# Patient Record
Sex: Male | Born: 1970 | Race: Black or African American | Hispanic: No | Marital: Married | Smoking: Current every day smoker
Health system: Southern US, Community
[De-identification: ages and names within clinical notes are randomized; demographics above are authoritative.]

---

## 2011-12-28 ENCOUNTER — Other Ambulatory Visit: Payer: Self-pay | Admitting: Specialist

## 2011-12-28 DIAGNOSIS — M545 Low back pain: Secondary | ICD-10-CM

## 2011-12-31 ENCOUNTER — Ambulatory Visit
Admission: RE | Admit: 2011-12-31 | Discharge: 2011-12-31 | Disposition: A | Discharge status: Home / Self Care | Payer: Worker's Compensation | Source: Ambulatory Visit | Attending: Specialist | Admitting: Specialist

## 2011-12-31 VITALS — BP 139/91 | HR 67 | Ht 71.0 in | Wt 256.0 lb

## 2011-12-31 DIAGNOSIS — M545 Low back pain: Secondary | ICD-10-CM

## 2011-12-31 MED ORDER — IOHEXOL 180 MG/ML  SOLN
15.0000 mL | Freq: Once | INTRAMUSCULAR | Status: AC | PRN
  Administered 2011-12-31: 15 mL via INTRATHECAL

## 2011-12-31 MED ORDER — DIAZEPAM 5 MG PO TABS
10.0000 mg | ORAL_TABLET | Freq: Once | ORAL | Status: AC
  Administered 2011-12-31: 10 mg via ORAL

## 2011-12-31 NOTE — Progress Notes (Signed)
Patient states he has been off Tramadol for the past two days.  jkl 

## 2013-05-03 ENCOUNTER — Emergency Department (HOSPITAL_COMMUNITY)
Admission: EM | Admit: 2013-05-03 | Discharge: 2013-05-03 | Disposition: A | Discharge status: Home / Self Care | Payer: 59 | Attending: Emergency Medicine | Admitting: Emergency Medicine

## 2013-05-03 ENCOUNTER — Emergency Department (HOSPITAL_COMMUNITY): Payer: 59

## 2013-05-03 ENCOUNTER — Encounter (HOSPITAL_COMMUNITY): Payer: Self-pay | Admitting: Emergency Medicine

## 2013-05-03 DIAGNOSIS — Y9389 Activity, other specified: Secondary | ICD-10-CM | POA: Insufficient documentation

## 2013-05-03 DIAGNOSIS — R55 Syncope and collapse: Secondary | ICD-10-CM

## 2013-05-03 DIAGNOSIS — W1809XA Striking against other object with subsequent fall, initial encounter: Secondary | ICD-10-CM | POA: Insufficient documentation

## 2013-05-03 DIAGNOSIS — S139XXA Sprain of joints and ligaments of unspecified parts of neck, initial encounter: Secondary | ICD-10-CM | POA: Insufficient documentation

## 2013-05-03 DIAGNOSIS — S161XXA Strain of muscle, fascia and tendon at neck level, initial encounter: Secondary | ICD-10-CM

## 2013-05-03 DIAGNOSIS — Y9289 Other specified places as the place of occurrence of the external cause: Secondary | ICD-10-CM | POA: Insufficient documentation

## 2013-05-03 DIAGNOSIS — S0990XA Unspecified injury of head, initial encounter: Secondary | ICD-10-CM | POA: Insufficient documentation

## 2013-05-03 LAB — CBC WITH DIFFERENTIAL/PLATELET
Eosinophils Absolute: 0.1 10*3/uL (ref 0.0–0.7)
HCT: 44 % (ref 39.0–52.0)
Hemoglobin: 15 g/dL (ref 13.0–17.0)
Lymphs Abs: 4.6 10*3/uL — ABNORMAL HIGH (ref 0.7–4.0)
MCH: 29.4 pg (ref 26.0–34.0)
Monocytes Absolute: 0.6 10*3/uL (ref 0.1–1.0)
Monocytes Relative: 6 % (ref 3–12)
Neutrophils Relative %: 47 % (ref 43–77)
RBC: 5.1 MIL/uL (ref 4.22–5.81)

## 2013-05-03 LAB — BASIC METABOLIC PANEL
BUN: 16 mg/dL (ref 6–23)
Chloride: 103 mEq/L (ref 96–112)
GFR calc non Af Amer: 57 mL/min — ABNORMAL LOW (ref >90)
Glucose, Bld: 94 mg/dL (ref 70–99)
Potassium: 3.8 mEq/L (ref 3.5–5.1)

## 2013-05-03 MED ORDER — CYCLOBENZAPRINE HCL 10 MG PO TABS: 10.0000 mg | ORAL_TABLET | Freq: Two times a day (BID) | ORAL | Status: DC | PRN

## 2013-05-03 MED ORDER — METHOCARBAMOL 500 MG PO TABS: 500.0000 mg | ORAL_TABLET | Freq: Two times a day (BID) | ORAL | Status: DC

## 2013-05-03 MED ORDER — DIAZEPAM 5 MG/ML IJ SOLN
5.0000 mg | Freq: Once | INTRAMUSCULAR | Status: AC
  Administered 2013-05-03: 5 mg via INTRAMUSCULAR
  Filled 2013-05-03: qty 2

## 2013-05-03 MED ORDER — HYDROCODONE-ACETAMINOPHEN 5-325 MG PO TABS: 2.0000 | ORAL_TABLET | ORAL | Status: DC | PRN

## 2013-05-03 MED ORDER — KETOROLAC TROMETHAMINE 60 MG/2ML IM SOLN
60.0000 mg | Freq: Once | INTRAMUSCULAR | Status: AC
  Administered 2013-05-03: 60 mg via INTRAMUSCULAR
  Filled 2013-05-03: qty 2

## 2013-05-03 NOTE — ED Provider Notes (Signed)
History     CSN: [id number]  Arrival date & time 05/03/13  2038   First MD Initiated Contact with Patient 05/03/13 2040      Chief Complaint  Patient presents with  . Syncopal Episode   . Head Injury    (Consider location/radiation/quality/duration/timing/severity/associated sxs/prior treatment) HPI Comments: Patient reports that last night he was talking with family and suddenly passed out. He says there was no warning sign, did not have any chest pain, shortness of breath, palpitations, dizziness, blurred vision. Family reports he fell backwards and he does have a large not on the back of his head. Patient does report a mild headache and moderate to severe pain across the back of the neck since the fall. He has taken Tylenol without improvement. Patient has not had any further symptoms today other than the neck pain. He has not had any recent illness, no fever, sore throat, cough, vomiting or diarrhea.  Patient is a 42 y.o. male presenting with head injury.  Head Injury Associated symptoms: headache and neck pain     No past medical history on file.  No past surgical history on file.  No family history on file.  History  Substance Use Topics  . Smoking status: Not on file  . Smokeless tobacco: Not on file  . Alcohol Use: Not on file      Review of Systems  HENT: Positive for neck pain.   Respiratory: Negative.   Cardiovascular: Negative.   Neurological: Positive for syncope and headaches.  All other systems reviewed and are negative.    Allergies  Review of patient's allergies indicates no known allergies.  Home Medications  No current outpatient prescriptions on file.  There were no vitals taken for this visit.  Physical Exam  Constitutional: He is oriented to person, place, and time. He appears well-developed and well-nourished. No distress.  HENT:  Head: Normocephalic and atraumatic.  Right Ear: Hearing normal.  Left Ear: Hearing normal.  Nose: Nose  normal.  Mouth/Throat: Oropharynx is clear and moist and mucous membranes are normal.  Eyes: Conjunctivae and EOM are normal. Pupils are equal, round, and reactive to light.  Neck: Normal range of motion. Neck supple. Muscular tenderness present. No spinous process tenderness present.  Cardiovascular: Regular rhythm, S1 normal and S2 normal.  Exam reveals no gallop and no friction rub.   No murmur heard. Pulmonary/Chest: Effort normal and breath sounds normal. No respiratory distress. He exhibits no tenderness.  Abdominal: Soft. Normal appearance and bowel sounds are normal. There is no hepatosplenomegaly. There is no tenderness. There is no rebound, no guarding, no tenderness at McBurney's point and negative Murphy's sign. No hernia.  Musculoskeletal: Normal range of motion.       Thoracic back: Normal.       Lumbar back: Normal.  Neurological: He is alert and oriented to person, place, and time. He has normal strength. No cranial nerve deficit or sensory deficit. Coordination normal. GCS eye subscore is 4. GCS verbal subscore is 5. GCS motor subscore is 6.  Skin: Skin is warm, dry and intact. No rash noted. No cyanosis.  Psychiatric: He has a normal mood and affect. His speech is normal and behavior is normal. Thought content normal.    ED Course  Procedures (including critical care time)  EKG:  Date: 05/03/2013  Rate: 65  Rhythm: normal sinus rhythm  QRS Axis: normal  Intervals: normal  ST/T Wave abnormalities: normal  Conduction Disutrbances:none  Narrative Interpretation:   Old EKG  Reviewed: none available    Labs Reviewed  CBC WITH DIFFERENTIAL - Abnormal; Notable for the following:    RDW 15.7 (*)    Lymphs Abs 4.6 (*)    All other components within normal limits  BASIC METABOLIC PANEL - Abnormal; Notable for the following:    Creatinine, Ser 1.47 (*)    GFR calc non Af Amer 57 (*)    GFR calc Af Amer 66 (*)    All other components within normal limits  TROPONIN I   D-DIMER, QUANTITATIVE  URINALYSIS, ROUTINE W REFLEX MICROSCOPIC   Dg Chest 2 View  05/03/2013  *RADIOLOGY REPORT*  Clinical Data: Fall, syncope, smoking history  CHEST - 2 VIEW  Comparison: none  Findings: Cardiomediastinal contours within normal range.  No confluent airspace opacity.  No pleural effusion or pneumothorax. No acute osseous finding.  IMPRESSION:  No radiographic evidence of acute cardiopulmonary process.   Original Report Authenticated By: Jearld Lesch, M.D.    Ct Head Wo Contrast  05/03/2013  *RADIOLOGY REPORT*  Clinical Data:  Syncope, head trauma.  CT HEAD WITHOUT CONTRAST CT CERVICAL SPINE WITHOUT CONTRAST  Technique:  Multidetector CT imaging of the head and cervical spine was performed following the standard protocol without intravenous contrast.  Multiplanar CT image reconstructions of the cervical spine were also generated.  Comparison:   None  CT HEAD  Findings: Posterior scalp swelling.  No underlying calvarial fracture. There is no evidence for acute hemorrhage, hydrocephalus, mass lesion, or abnormal extra-axial fluid collection.  No definite CT evidence for acute infarction.  The visualized paranasal sinuses and mastoid air cells are predominately clear.  IMPRESSION: Posterior scalp swelling.  No underlying calvarial fracture or acute intracranial abnormality.  CT CERVICAL SPINE  Findings: Lung apices are clear.  Maintained craniocervical relationship.  No dens fracture.  Loss of normal cervical lordosis. Maintained vertebral body height.  No dislocation.  Paravertebral soft tissues within normal range.  IMPRESSION: Loss of normal cervical lordosis may be secondary to positioning, muscle spasm, or ligamentous injury.  No static evidence for acute fracture or dislocation of the cervical spine.   Original Report Authenticated By: Jearld Lesch, M.D.    Ct Cervical Spine Wo Contrast  05/03/2013  *RADIOLOGY REPORT*  Clinical Data:  Syncope, head trauma.  CT HEAD WITHOUT  CONTRAST CT CERVICAL SPINE WITHOUT CONTRAST  Technique:  Multidetector CT imaging of the head and cervical spine was performed following the standard protocol without intravenous contrast.  Multiplanar CT image reconstructions of the cervical spine were also generated.  Comparison:   None  CT HEAD  Findings: Posterior scalp swelling.  No underlying calvarial fracture. There is no evidence for acute hemorrhage, hydrocephalus, mass lesion, or abnormal extra-axial fluid collection.  No definite CT evidence for acute infarction.  The visualized paranasal sinuses and mastoid air cells are predominately clear.  IMPRESSION: Posterior scalp swelling.  No underlying calvarial fracture or acute intracranial abnormality.  CT CERVICAL SPINE  Findings: Lung apices are clear.  Maintained craniocervical relationship.  No dens fracture.  Loss of normal cervical lordosis. Maintained vertebral body height.  No dislocation.  Paravertebral soft tissues within normal range.  IMPRESSION: Loss of normal cervical lordosis may be secondary to positioning, muscle spasm, or ligamentous injury.  No static evidence for acute fracture or dislocation of the cervical spine.   Original Report Authenticated By: Jearld Lesch, M.D.      Diagnosis: 1. Syncope 2. Cervical strain    MDM  Patient presents  to the ER for evaluation of neck pain from a fall. The patient reports that he passed out yesterday, fell backwards and this was the cause of her neck pain. He has a normal neurologic examination currently. Pain does not radiate to the arms, no upper extremity weakness. CT of head shows hematoma without any intracranial injury. CT of cervical spine shows spasm without any evidence of bone injury. Patient treated with analgesia and muscle relaxers.  It's not clear what caused a syncopal episode yesterday. Further discussion with the patient, however, reveals that he worked all day yesterday and then drove to [place] to see family. He  didn't appear to 4 AM and at 7 AM yesterday morning. He thinks he was just overtired. Patient was counseled that he needs to get followup with his primary care doctor needs to be cautious about driving until he is certain that he is feeling better. He understands the risks.       Gilda Crease, MD 05/03/13 2234

## 2013-05-03 NOTE — ED Notes (Addendum)
MD at bedside. Dr. Pollina at bedside.  

## 2013-05-03 NOTE — ED Notes (Signed)
Patient transported to CT 

## 2013-05-03 NOTE — ED Notes (Signed)
Pt states he was talking with a family member last night and just "passed out". Pt states he doesn't know why he passed out. States he hit his head on concrete and has a knot on the back of his head. Pt denies any nausea or vomiting since event happened. Pt states his main complaint now is neck pain. Pt states he has taken Tylenol today for pain. Pt arrives with family member. Pt is a/o x 4.

## 2013-09-08 IMAGING — CT CT CERVICAL SPINE W/O CM
2 of 4 series · 6 of 14 positions shown, 7 images · non-contrast
Comparison: None

CT HEAD

CLINICAL DATA: Syncope, head trauma.

CT HEAD WITHOUT CONTRAST
CT CERVICAL SPINE WITHOUT CONTRAST
TECHNIQUE: Multidetector CT imaging of the head and cervical spine
was performed following the standard protocol without intravenous
contrast.  Multiplanar CT image reconstructions of the cervical
spine were also generated.

[Series 5: c-spine st · axial · 0.29mm/px · z∈[+1136,+1220]mm · 3 of 86 slices shown]
[im 22/86  bone]
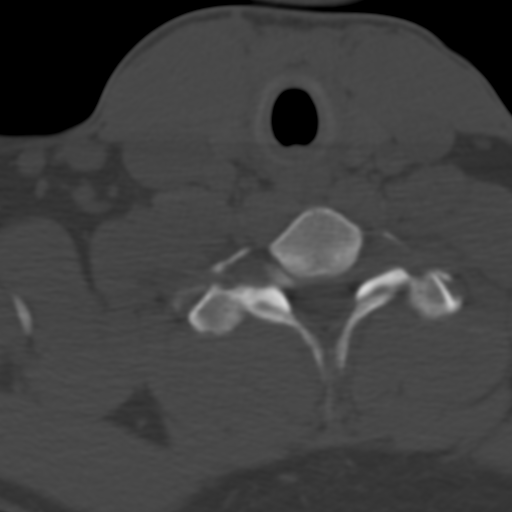
[im 43/86  bone]
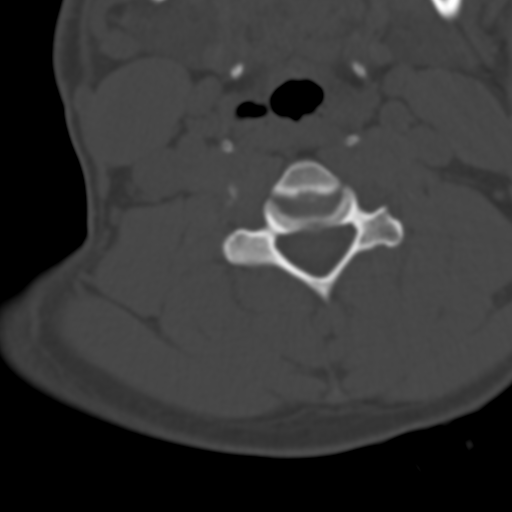
[im 64/86  bone]
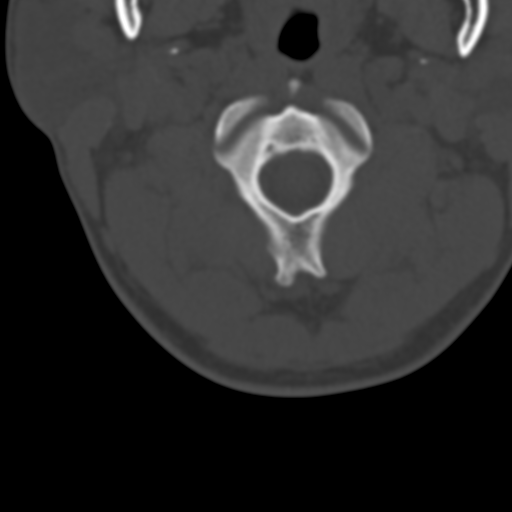

[Series 8: axial recon · axial · 0.23mm/px · z∈[+1112,+1209]mm · 3 of 100 slices shown, 4 images]
[im 25/100  soft-tissue]
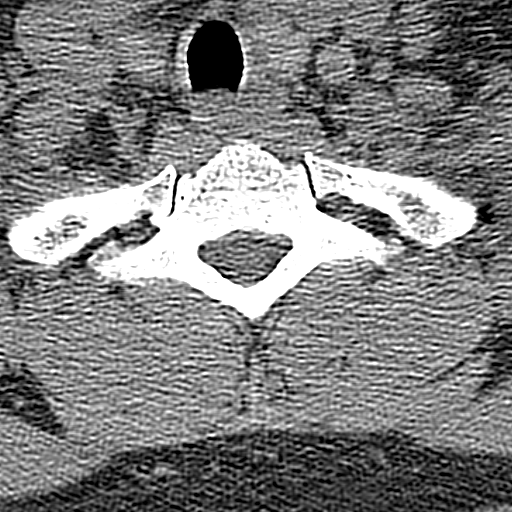
[im 25/100  bone]
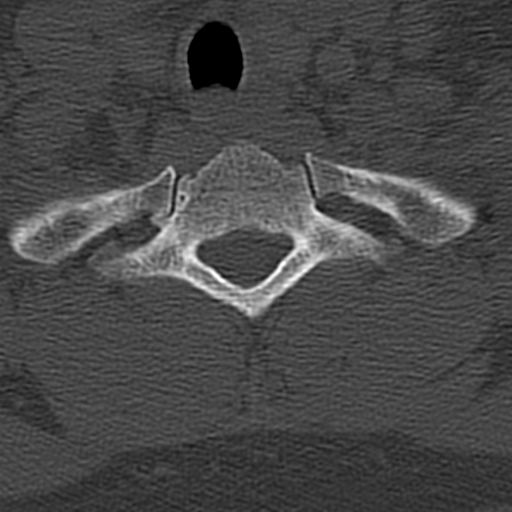
[im 50/100  bone]
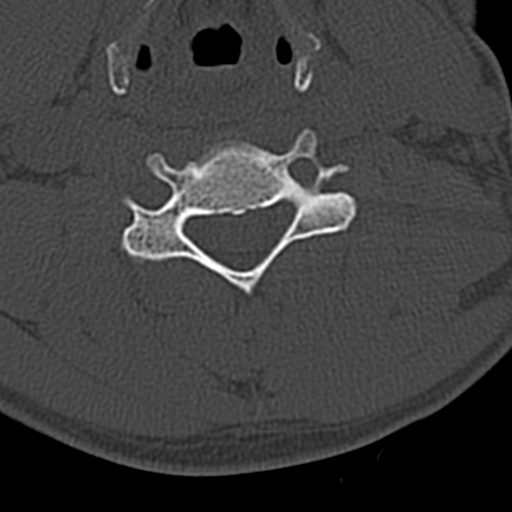
[im 75/100  bone]
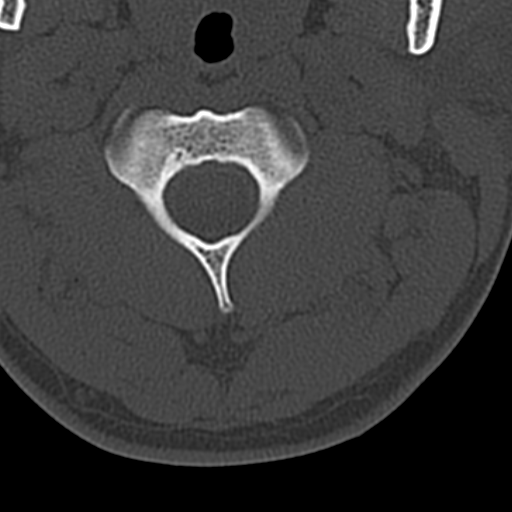

[6 of 14 positions shown; findings below may reference images not displayed]

FINDINGS: Posterior scalp swelling.  No underlying calvarial
fracture. There is no evidence for acute hemorrhage, hydrocephalus,
mass lesion, or abnormal extra-axial fluid collection.  No definite
CT evidence for acute infarction.  The visualized paranasal sinuses
and mastoid air cells are predominately clear.
IMPRESSION: Posterior scalp swelling.  No underlying calvarial fracture or
acute intracranial abnormality.

CT CERVICAL SPINE
FINDINGS: Lung apices are clear.  Maintained craniocervical
relationship.  No dens fracture.  Loss of normal cervical lordosis.
Maintained vertebral body height.  No dislocation.  Paravertebral
soft tissues within normal range.
IMPRESSION: Loss of normal cervical lordosis may be secondary to positioning,
muscle spasm, or ligamentous injury.  No static evidence for acute
fracture or dislocation of the cervical spine.

## 2013-09-08 IMAGING — CR DG CHEST 2V
2 series · 2 of 2 positions shown · non-contrast
Comparison: none

CLINICAL DATA: Fall, syncope, smoking history

CHEST - 2 VIEW

[w chest pa]
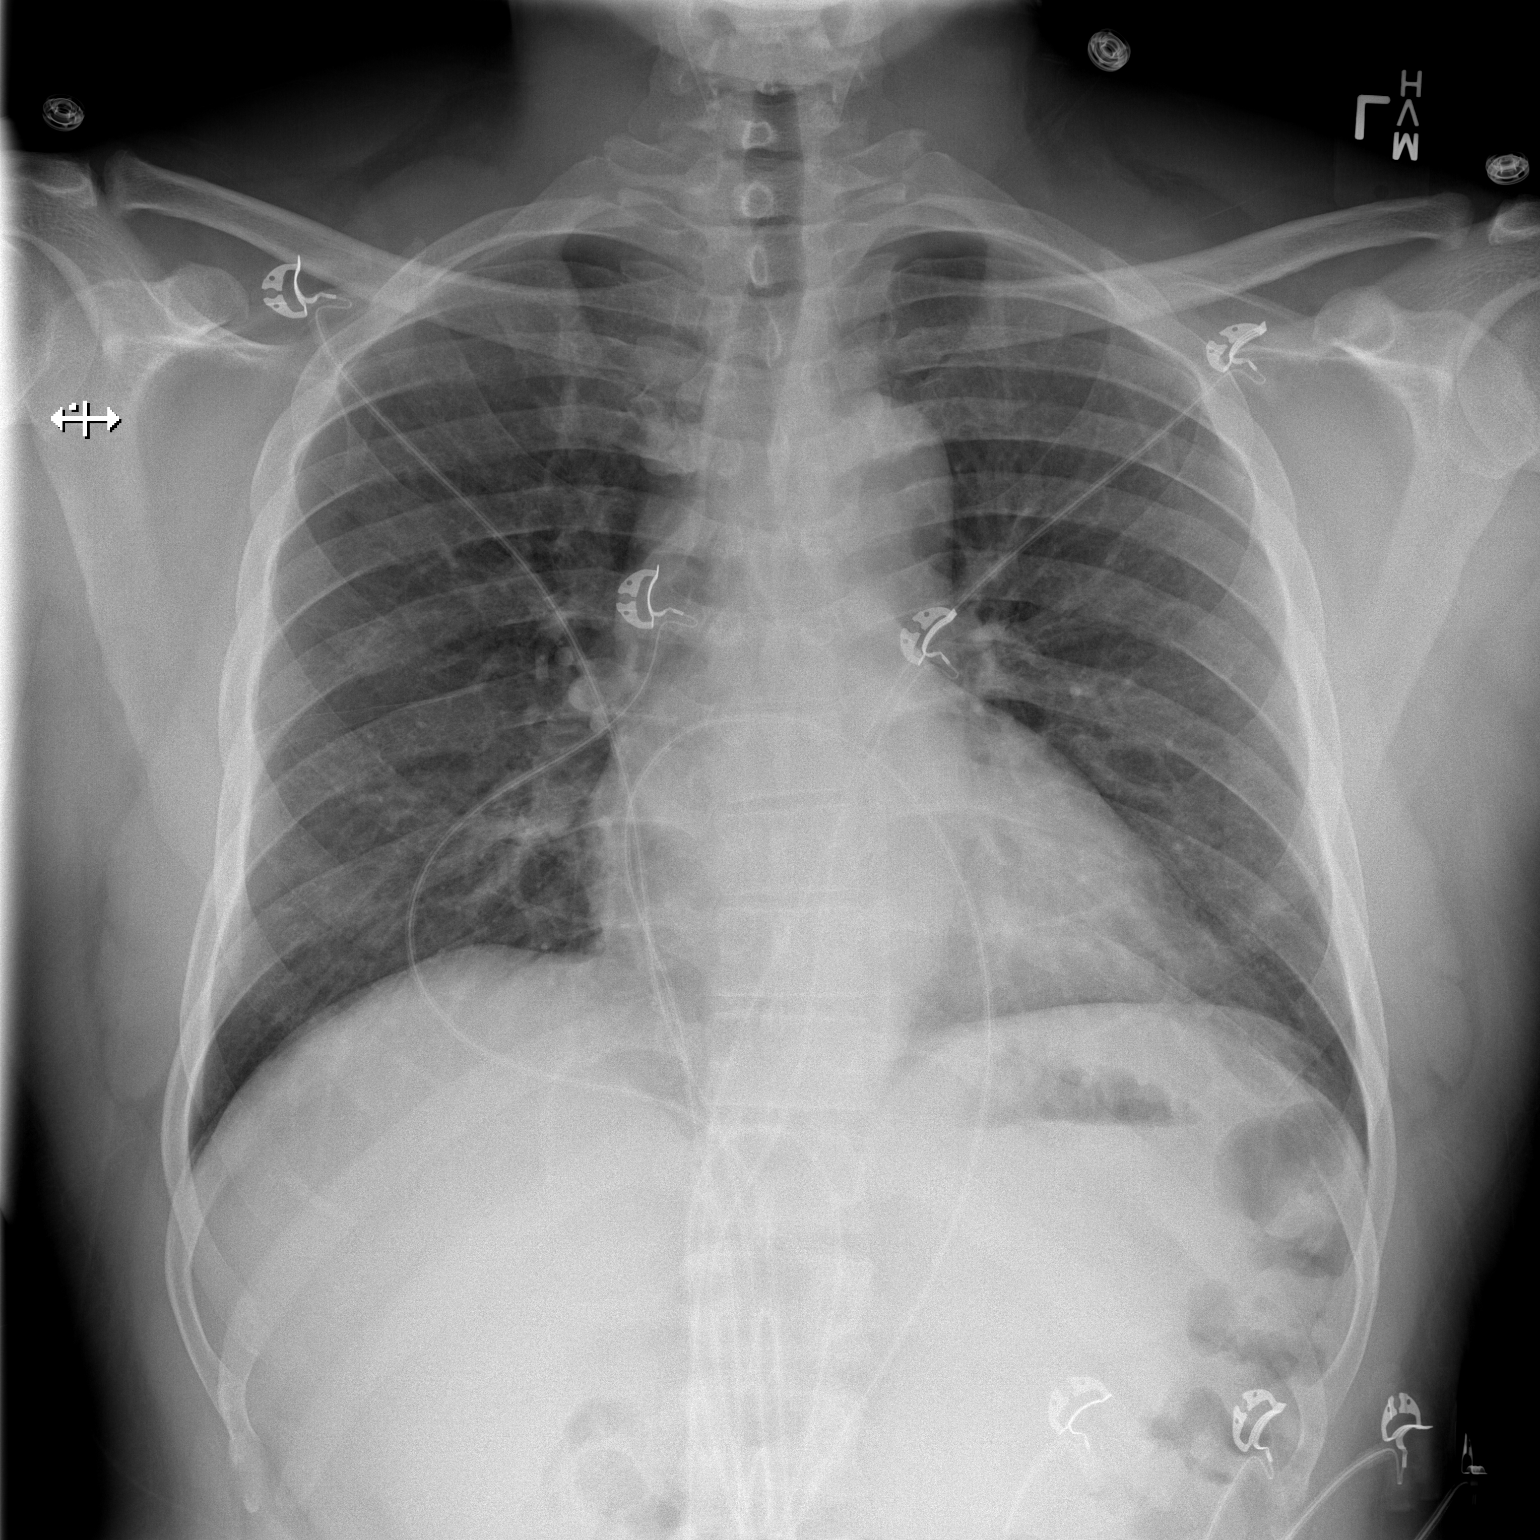

[w chest lat]
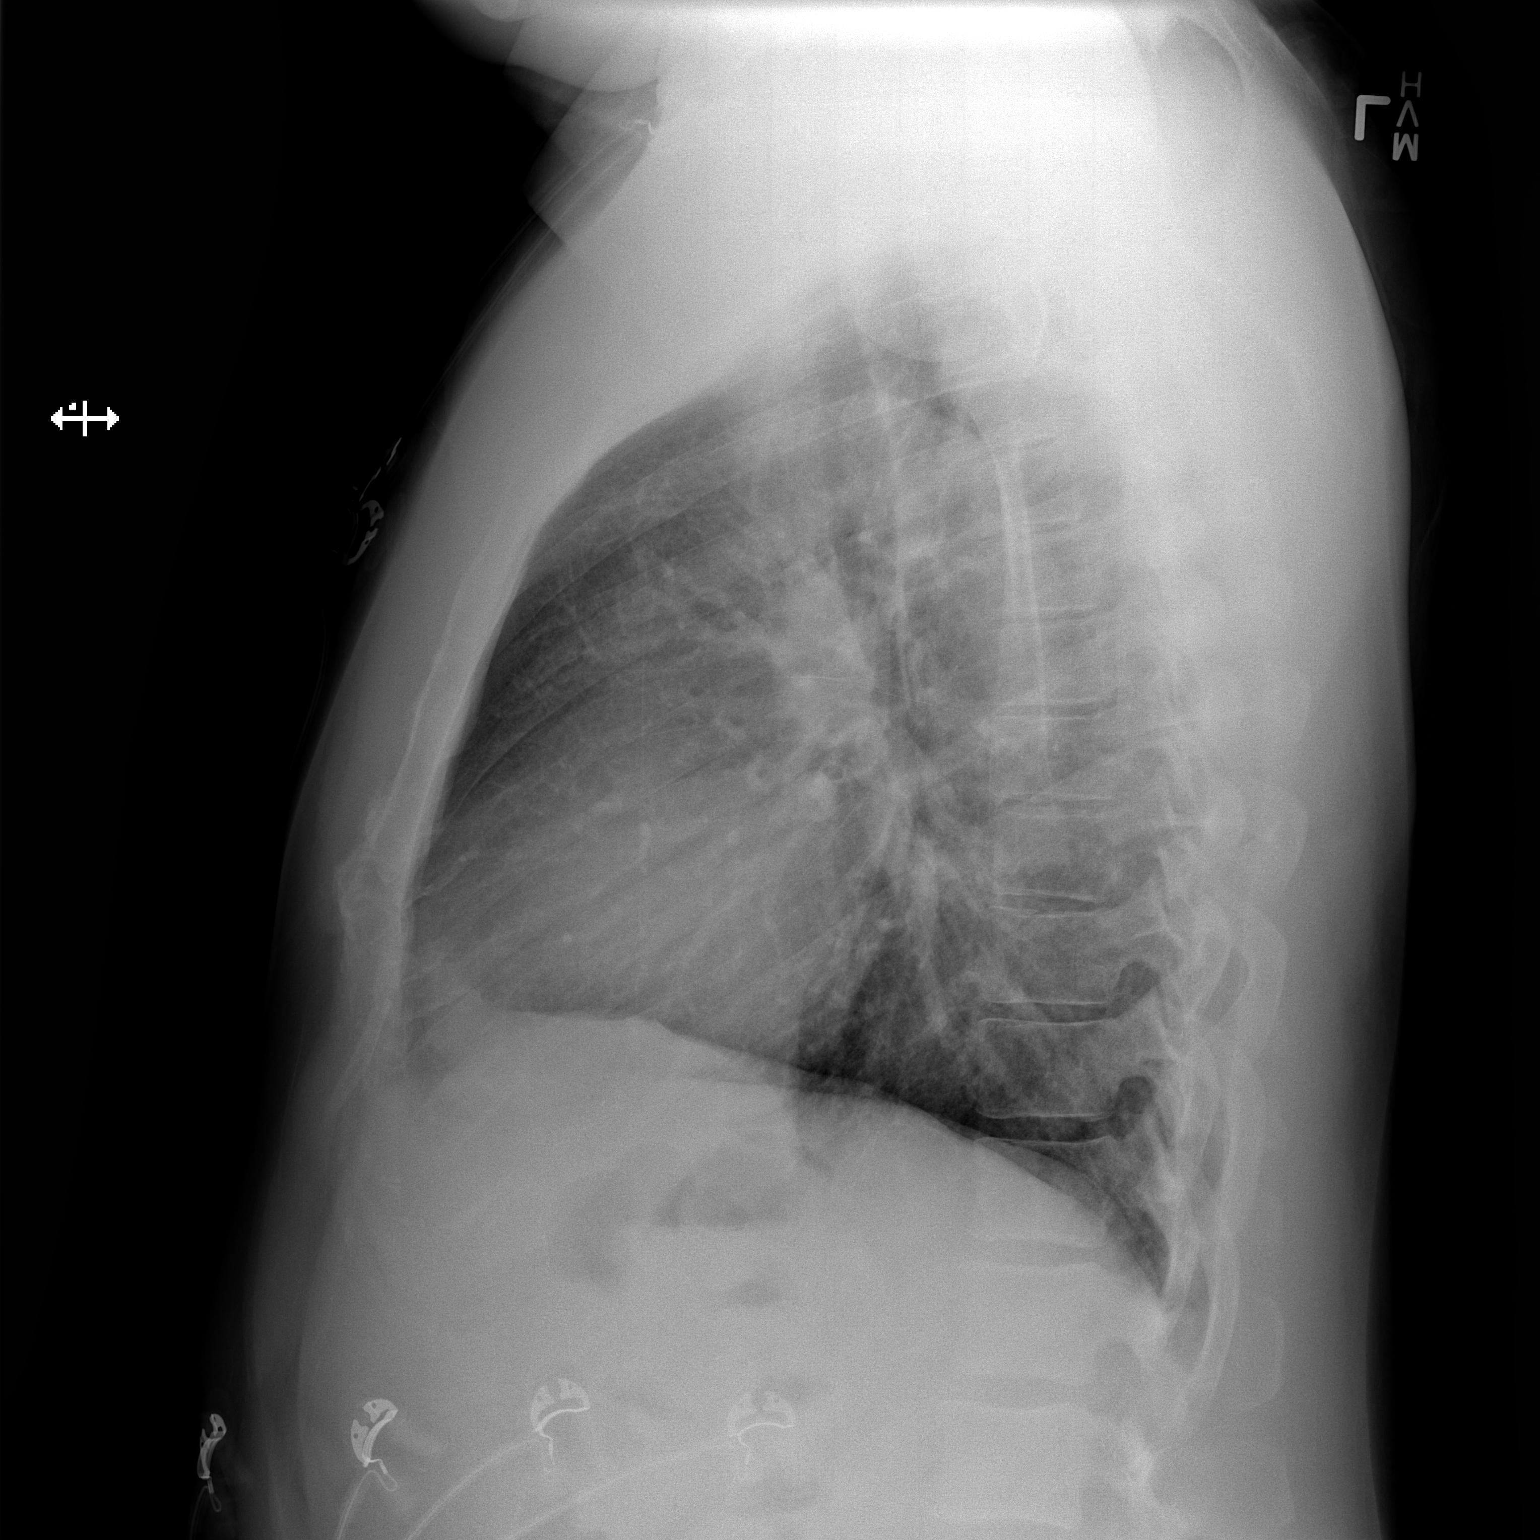

[2 of 2 positions shown; findings below may reference images not displayed]

FINDINGS: Cardiomediastinal contours within normal range.  No
confluent airspace opacity.  No pleural effusion or pneumothorax.
No acute osseous finding.
IMPRESSION: No radiographic evidence of acute cardiopulmonary process.}

## 2014-08-06 ENCOUNTER — Emergency Department (HOSPITAL_COMMUNITY)
Admission: EM | Admit: 2014-08-06 | Discharge: 2014-08-06 | Disposition: A | Discharge status: Home / Self Care | Payer: 59 | Attending: Emergency Medicine | Admitting: Emergency Medicine

## 2014-08-06 DIAGNOSIS — T63461A Toxic effect of venom of wasps, accidental (unintentional), initial encounter: Secondary | ICD-10-CM | POA: Insufficient documentation

## 2014-08-06 DIAGNOSIS — Y939 Activity, unspecified: Secondary | ICD-10-CM | POA: Insufficient documentation

## 2014-08-06 DIAGNOSIS — J3489 Other specified disorders of nose and nasal sinuses: Secondary | ICD-10-CM | POA: Diagnosis not present

## 2014-08-06 DIAGNOSIS — Y929 Unspecified place or not applicable: Secondary | ICD-10-CM | POA: Insufficient documentation

## 2014-08-06 DIAGNOSIS — T6391XA Toxic effect of contact with unspecified venomous animal, accidental (unintentional), initial encounter: Secondary | ICD-10-CM | POA: Diagnosis not present

## 2014-08-06 DIAGNOSIS — T63441A Toxic effect of venom of bees, accidental (unintentional), initial encounter: Secondary | ICD-10-CM

## 2014-08-06 MED ORDER — PREDNISONE 20 MG PO TABS
60.0000 mg | ORAL_TABLET | Freq: Once | ORAL | Status: AC
  Administered 2014-08-06: 60 mg via ORAL
  Filled 2014-08-06: qty 3

## 2014-08-06 MED ORDER — FAMOTIDINE 20 MG PO TABS
40.0000 mg | ORAL_TABLET | Freq: Once | ORAL | Status: AC
  Administered 2014-08-06: 40 mg via ORAL
  Filled 2014-08-06: qty 2

## 2014-08-06 MED ORDER — PREDNISONE 10 MG PO TABS: 60.0000 mg | ORAL_TABLET | Freq: Every day | ORAL | Status: DC

## 2014-08-06 MED ORDER — EPINEPHRINE 0.3 MG/0.3ML IJ SOAJ: 0.3000 mg | Freq: Once | INTRAMUSCULAR | Status: AC

## 2014-08-06 NOTE — ED Provider Notes (Signed)
CSN: [id number]     Arrival date & time 08/06/14  1007 History   First MD Initiated Contact with Patient 08/06/14 1009     Chief Complaint  Patient presents with  . Allergic Reaction     (Consider location/radiation/quality/duration/timing/severity/associated sxs/prior Treatment) Patient is a 43 y.o. male presenting with allergic reaction and general illness. The history is provided by the patient.  Allergic Reaction Presenting symptoms: itching, rash and swelling   Presenting symptoms: no wheezing   Severity:  Moderate Prior allergic episodes:  No prior episodes Context: insect bite/sting   Relieved by:  Nothing Worsened by:  Nothing tried Ineffective treatments:  Antihistamines Illness Severity:  Severe Onset quality:  Sudden Duration:  1 hour Timing:  Constant Progression:  Unchanged Chronicity:  New Associated symptoms: rash   Associated symptoms: no abdominal pain, no chest pain, no congestion, no diarrhea, no fever, no headaches, no myalgias, no shortness of breath, no vomiting and no wheezing    43 yo M with a chief complaint allergic reaction. Patient was stung by a bee approximately 30-45 minutes ago. Patient was stung to the right side of his neck unknown if he removed the stinger. After which patient developed a rash all over her stuffy nose congestion. Patient denies any difficulty breathing patient denies any vomiting or diarrhea. Patient denies any lightheadedness or palpitations. Patient denies any prior reaction like this.   No past medical history on file. No past surgical history on file. No family history on file. History  Substance Use Topics  . Smoking status: Not on file  . Smokeless tobacco: Not on file  . Alcohol Use: Not on file    Review of Systems  Constitutional: Negative for fever and chills.  HENT: Negative for congestion and facial swelling.   Eyes: Negative for discharge and visual disturbance.  Respiratory: Negative for shortness of  breath and wheezing.   Cardiovascular: Negative for chest pain and palpitations.  Gastrointestinal: Negative for vomiting, abdominal pain and diarrhea.  Musculoskeletal: Negative for arthralgias and myalgias.  Skin: Positive for itching and rash. Negative for color change.  Neurological: Negative for tremors, syncope and headaches.  Psychiatric/Behavioral: Negative for confusion and dysphoric mood.      Allergies  Bee venom  Home Medications   Prior to Admission medications   Medication Sig Start Date End Date Taking? Authorizing Provider  ibuprofen (ADVIL,MOTRIN) 200 MG tablet Take 400 mg by mouth daily as needed for headache.   Yes Historical Provider, MD  EPINEPHrine (EPIPEN 2-PAK) 0.3 mg/0.3 mL IJ SOAJ injection Inject 0.3 mLs (0.3 mg total) into the muscle once. 08/06/14   Melene Planan Inigo Lantigua, MD  predniSONE (DELTASONE) 10 MG tablet Take 6 tablets (60 mg total) by mouth daily. 08/06/14   Melene Planan Deloy Archey, MD   BP 140/97  Temp(Src) 97.6 F (36.4 C) (Oral)  Resp 16  SpO2 99% Physical Exam  Constitutional: He is oriented to person, place, and time. He appears well-developed and well-nourished.  HENT:  Head: Normocephalic and atraumatic.  Lip and facial swelling, erythema to the right side of the neck.  No noted intraoral swelling.  Eyes: EOM are normal. Pupils are equal, round, and reactive to light.  Neck: Normal range of motion. Neck supple. No JVD present.  Cardiovascular: Normal rate and regular rhythm.  Exam reveals no gallop and no friction rub.   No murmur heard. Pulmonary/Chest: No respiratory distress. He has no wheezes. He has no rales.  Abdominal: He exhibits no distension. There is no rebound and  no guarding.  Musculoskeletal: Normal range of motion.  Neurological: He is alert and oriented to person, place, and time.  Skin: Rash noted. No pallor.  Diffuse raised wheals throughout the body worse on abdomen and upper extremities.  Psychiatric: He has a normal mood and affect. His  behavior is normal.    ED Course  Procedures (including critical care time) Labs Review Labs Reviewed - No data to display  Imaging Review No results found.   EKG Interpretation None      MDM   Final diagnoses:  Allergic reaction to bee sting, accidental or unintentional, initial encounter    43 yo M with a chief complaint of allergic reaction. Patient now filleting definition of severe allergic reaction at this time. Patient took 2 Benadryl tablets at home. We'll treat with Zantac and prednisone. We'll observe the patient.  Symptoms improved, patient requesting discharge, will have follow up with wellness center.   11:49 AM:  I have discussed the diagnosis/risks/treatment options with the patient and believe the pt to be eligible for discharge home to follow-up with [facility] center. We also discussed returning to the ED immediately if new or worsening sx occur. We discussed the sx which are most concerning (e.g., sudden recurrence of symptoms, difficulty breathing, nausea, diarrhea) that necessitate immediate return. Medications administered to the patient during their visit and any new prescriptions provided to the patient are listed below.  Medications given during this visit Medications  famotidine (PEPCID) tablet 40 mg (40 mg Oral Given 08/06/14 1025)  predniSONE (DELTASONE) tablet 60 mg (60 mg Oral Given 08/06/14 1025)    New Prescriptions   EPINEPHRINE (EPIPEN 2-PAK) 0.3 MG/0.3 ML IJ SOAJ INJECTION    Inject 0.3 mLs (0.3 mg total) into the muscle once.   PREDNISONE (DELTASONE) 10 MG TABLET    Take 6 tablets (60 mg total) by mouth daily.       Melene Plan, MD 08/06/14 1149

## 2014-08-06 NOTE — ED Notes (Signed)
Is allergic to bee stings aND WAS STUNG IN NECK TODAY BROKE OUT IN HIVES AND NOSE IS STUFFY took 2 benadryl already

## 2014-08-09 NOTE — ED Provider Notes (Signed)
I saw and evaluated the patient, reviewed the resident's note and I agree with the findings and plan.   EKG Interpretation None       Patient with hives s/p insect sting. No throat or lung sx. No vomiting. No signs of severe rxn. Stable for d/c  Audree CamelScott T Dezyrae Kensinger, MD 08/09/14 1402

## 2017-01-10 ENCOUNTER — Encounter (HOSPITAL_COMMUNITY): Payer: Self-pay | Admitting: Emergency Medicine

## 2017-01-10 ENCOUNTER — Emergency Department (HOSPITAL_COMMUNITY)
Admission: EM | Admit: 2017-01-10 | Discharge: 2017-01-10 | Disposition: A | Discharge status: Home / Self Care | Payer: 59 | Attending: Emergency Medicine | Admitting: Emergency Medicine

## 2017-01-10 DIAGNOSIS — R509 Fever, unspecified: Secondary | ICD-10-CM | POA: Diagnosis not present

## 2017-01-10 DIAGNOSIS — R52 Pain, unspecified: Secondary | ICD-10-CM

## 2017-01-10 DIAGNOSIS — R6889 Other general symptoms and signs: Secondary | ICD-10-CM

## 2017-01-10 DIAGNOSIS — R05 Cough: Secondary | ICD-10-CM | POA: Diagnosis not present

## 2017-01-10 DIAGNOSIS — R059 Cough, unspecified: Secondary | ICD-10-CM

## 2017-01-10 DIAGNOSIS — F17200 Nicotine dependence, unspecified, uncomplicated: Secondary | ICD-10-CM | POA: Diagnosis not present

## 2017-01-10 MED ORDER — ACETAMINOPHEN 325 MG PO TABS
650.0000 mg | ORAL_TABLET | Freq: Once | ORAL | Status: AC | PRN
  Administered 2017-01-10: 650 mg via ORAL

## 2017-01-10 MED ORDER — FLUTICASONE PROPIONATE 50 MCG/ACT NA SUSP: 2.0000 | Freq: Every day | NASAL | 0 refills | Status: DC

## 2017-01-10 MED ORDER — OSELTAMIVIR PHOSPHATE 75 MG PO CAPS: 75.0000 mg | ORAL_CAPSULE | Freq: Two times a day (BID) | ORAL | 0 refills | Status: DC

## 2017-01-10 MED ORDER — ACETAMINOPHEN 325 MG PO TABS
ORAL_TABLET | ORAL | Status: AC
  Filled 2017-01-10: qty 2

## 2017-01-10 MED ORDER — HYDROCODONE-HOMATROPINE 5-1.5 MG/5ML PO SYRP: 5.0000 mL | ORAL_SOLUTION | Freq: Four times a day (QID) | ORAL | 0 refills | Status: DC | PRN

## 2017-01-10 MED ORDER — GUAIFENESIN 100 MG/5ML PO LIQD: 100.0000 mg | ORAL | 0 refills | Status: DC | PRN

## 2017-01-10 NOTE — ED Provider Notes (Signed)
[facility] Provider Note   CSN: [id number] Arrival date & time: 01/10/17  1742  By signing my name below, I, Linna DarnerRussell Turner, attest that this documentation has been prepared under the direction and in the presence of Sharen Hecklaudia Javan Gonzaga, PA-C. Electronically Signed: Linna Darnerussell Turner, Scribe. 01/10/2017. 9:17 PM.  History   Chief Complaint Chief Complaint  Patient presents with  . Generalized Body Aches  . Fever    The history is provided by the patient. No language interpreter was used.     HPI Comments: Joel Cruz is a 46 y.o. male who presents to the Emergency Department complaining of constant, gradually worsening, generalized body aches beginning two days ago. He reports associated subjective fever/chills, nasal congestion, decreased appetite, and a dry cough. He has tried Tylenol and Catering managerAlka Seltzer for his symptoms with minimal improvement. He notes he has Flonase at home but has not used it for his current symptoms. No known sick contacts with similar symptoms. Pt smokes ~ 1 ppd but has not smoked in the last two days. He did not receive a flu vaccination this season. No h/o influenza. No h/o asthma or COPD. No regular medications. He denies CP, SOB, sore throat, nausea, vomiting, or any other associated symptoms.  History reviewed. No pertinent past medical history.  There are no active problems to display for this patient.   History reviewed. No pertinent surgical history.     Home Medications    Prior to Admission medications   Medication Sig Start Date End Date Taking? Authorizing Provider  EPINEPHrine (EPIPEN 2-PAK) 0.3 mg/0.3 mL IJ SOAJ injection Inject 0.3 mLs (0.3 mg total) into the muscle once. 08/06/14   Melene Planan Floyd, DO  fluticasone (FLONASE) 50 MCG/ACT nasal spray Place 2 sprays into both nostrils daily. 01/10/17   Liberty Handylaudia J Hazelee Harbold, PA-C  guaiFENesin (ROBITUSSIN) 100 MG/5ML liquid Take 5-10 mLs (100-200 mg total) by mouth every 4 (four) hours as needed for  cough. 01/10/17   Liberty Handylaudia J Liesl Simons, PA-C  HYDROcodone-homatropine (HYCODAN) 5-1.5 MG/5ML syrup Take 5 mLs by mouth every 6 (six) hours as needed for cough. 01/10/17   Liberty Handylaudia J Tamotsu Wiederholt, PA-C  ibuprofen (ADVIL,MOTRIN) 200 MG tablet Take 400 mg by mouth daily as needed for headache.    Historical Provider, MD  oseltamivir (TAMIFLU) 75 MG capsule Take 1 capsule (75 mg total) by mouth every 12 (twelve) hours. 01/10/17   Liberty Handylaudia J Samul Mcinroy, PA-C  predniSONE (DELTASONE) 10 MG tablet Take 6 tablets (60 mg total) by mouth daily. 08/06/14   Melene Planan Floyd, DO    Family History History reviewed. No pertinent family history.  Social History Social History  Substance Use Topics  . Smoking status: Current Every Day Smoker  . Smokeless tobacco: Never Used  . Alcohol use Yes     Allergies   Bee venom   Review of Systems Review of Systems  Constitutional: Positive for appetite change (decreased), chills and fever.  HENT: Positive for congestion. Negative for sore throat.   Eyes: Negative for visual disturbance.  Respiratory: Positive for cough. Negative for chest tightness and shortness of breath.   Cardiovascular: Negative for chest pain.  Gastrointestinal: Negative for abdominal pain, constipation, diarrhea, nausea and vomiting.  Genitourinary: Negative for decreased urine volume and difficulty urinating.  Musculoskeletal: Positive for myalgias. Negative for arthralgias and joint swelling.  Skin: Negative for rash.  Neurological: Negative for dizziness, light-headedness and headaches.     Physical Exam Updated Vital Signs BP 143/97 (BP Location: Right Arm)   Pulse  77   Temp 98.3 F (36.8 C) (Oral)   Resp 18   SpO2 100%   Physical Exam  Constitutional: He is oriented to person, place, and time. He appears well-developed and well-nourished. No distress.  HENT:  Head: Normocephalic and atraumatic.  Nose: Nose normal.  Mouth/Throat: Oropharynx is clear and moist. No oropharyngeal exudate.    Mildly edematous inferior turbinates without nasal discharge. Septum midline. Mildly erythematous oropharynx and tonsils without edema or exudates.   Eyes: Conjunctivae and EOM are normal. Pupils are equal, round, and reactive to light. No scleral icterus.  Neck: Normal range of motion. Neck supple. No JVD present. No tracheal deviation present.  Cardiovascular: Normal rate, regular rhythm, normal heart sounds and intact distal pulses.   No murmur heard. Pulmonary/Chest: Effort normal and breath sounds normal. He has no wheezes.  Abdominal: Soft. He exhibits no distension. There is no tenderness.  Musculoskeletal: Normal range of motion. He exhibits no deformity.  Lymphadenopathy:    He has no cervical adenopathy.  Neurological: He is alert and oriented to person, place, and time.  Skin: Skin is warm and dry. Capillary refill takes less than 2 seconds.  Psychiatric: He has a normal mood and affect. His behavior is normal. Judgment and thought content normal.  Nursing note and vitals reviewed.    ED Treatments / Results  Labs (all labs ordered are listed, but only abnormal results are displayed) Labs Reviewed - No data to display  EKG  EKG Interpretation None       Radiology No results found.  Procedures Procedures (including critical care time)  DIAGNOSTIC STUDIES: Oxygen Saturation is 99% on RA, normal by my interpretation.    COORDINATION OF CARE: 9:25 PM Discussed treatment plan with pt at bedside and pt agreed to plan.  Medications Ordered in ED Medications  acetaminophen (TYLENOL) 325 MG tablet (not administered)  acetaminophen (TYLENOL) tablet 650 mg (650 mg Oral Given 01/10/17 1751)     Initial Impression / Assessment and Plan / ED Course  I have reviewed the triage vital signs and the nursing notes.  Pertinent labs & imaging results that were available during my care of the patient were reviewed by me and considered in my medical decision making (see chart  for details).      Patient with symptoms consistent with influenza. Vitals are stable, low-grade fever. No signs of dehydration, tolerating PO's. Lungs are clear.  No CXR indicated at this time. Supportive therapy indicated with return if symptoms worsen. Patient counseled.  Final Clinical Impressions(s) / ED Diagnoses   Final diagnoses:  Body aches  Fever, unspecified fever cause  Cough  Flu-like symptoms    New Prescriptions Discharge Medication List as of 01/10/2017 10:53 PM    START taking these medications   Details  fluticasone (FLONASE) 50 MCG/ACT nasal spray Place 2 sprays into both nostrils daily., Starting Thu 01/10/2017, Print    guaiFENesin (ROBITUSSIN) 100 MG/5ML liquid Take 5-10 mLs (100-200 mg total) by mouth every 4 (four) hours as needed for cough., Starting Thu 01/10/2017, Print    HYDROcodone-homatropine (HYCODAN) 5-1.5 MG/5ML syrup Take 5 mLs by mouth every 6 (six) hours as needed for cough., Starting Thu 01/10/2017, Print    oseltamivir (TAMIFLU) 75 MG capsule Take 1 capsule (75 mg total) by mouth every 12 (twelve) hours., Starting Thu 01/10/2017, Print       I personally performed the services described in this documentation, which was scribed in my presence. The recorded information has been reviewed  and is accurate.    Liberty Handy, PA-C 01/10/17 2316    Jacalyn Lefevre, MD 01/10/17 2351

## 2017-01-10 NOTE — ED Notes (Signed)
ED Provider at bedside. 

## 2017-01-10 NOTE — Discharge Instructions (Signed)
Your symptoms are most likely due to a viral upper respiratory infection, likely influenza. You have been prescribed Flonase for nasal congestion, Hycodan syrup for cough suppression and Mucinex to thin out mucus. You also have a prescription for Tamiflu, a medication that can help your influenza like symptoms last less time.  Your symptoms should start improving in the next 7-10 days. You may take ibuprofen or Tylenol for general body aches and fever. Make sure you stay well-hydrated and drink plenty of water. Get plenty of rest.  ° °Return to the emergency department if your symptoms worsen after a total of 10 days or if you develop chest pain, shortness of breath. ° °Please read the attached information on influenza and viral respiratory infections. °

## 2017-01-10 NOTE — ED Notes (Signed)
Pt ambulatory at DC. NAD. Pt verbalized DC teaching and medications.  

## 2017-01-10 NOTE — ED Triage Notes (Signed)
Pt sts fever and body aches x 2 days  

## 2017-01-21 ENCOUNTER — Ambulatory Visit (HOSPITAL_COMMUNITY)
Admission: EM | Admit: 2017-01-21 | Discharge: 2017-01-21 | Disposition: A | Discharge status: Home / Self Care | Payer: 59 | Attending: Family Medicine | Admitting: Family Medicine

## 2017-01-21 ENCOUNTER — Encounter (HOSPITAL_COMMUNITY): Payer: Self-pay | Admitting: *Deleted

## 2017-01-21 DIAGNOSIS — L0201 Cutaneous abscess of face: Secondary | ICD-10-CM

## 2017-01-21 MED ORDER — DOXYCYCLINE HYCLATE 100 MG PO TABS: 100.0000 mg | ORAL_TABLET | Freq: Two times a day (BID) | ORAL | 0 refills | Status: AC

## 2017-01-21 NOTE — ED Provider Notes (Signed)
[facility]    CSN: [id number] Arrival date & time: 01/21/17  1600     History   Chief Complaint Chief Complaint  Patient presents with  . Mass    HPI Joel Cruz is a 46 y.o. male.   This is a 46 year old man who is here for evaluation of swelling on his left face. This developed over the last several days. It is mildly tender. He's had no fever. He's never had this before.      History reviewed. No pertinent past medical history.  There are no active problems to display for this patient.   History reviewed. No pertinent surgical history.     Home Medications    Prior to Admission medications   Medication Sig Start Date End Date Taking? Authorizing Provider  doxycycline (VIBRA-TABS) 100 MG tablet Take 1 tablet (100 mg total) by mouth 2 (two) times daily. 01/21/17   Elvina Sidle, MD  EPINEPHrine (EPIPEN 2-PAK) 0.3 mg/0.3 mL IJ SOAJ injection Inject 0.3 mLs (0.3 mg total) into the muscle once. 08/06/14   Melene Plan, DO    Family History History reviewed. No pertinent family history.  Social History Social History  Substance Use Topics  . Smoking status: Current Every Day Smoker  . Smokeless tobacco: Never Used  . Alcohol use Yes     Allergies   Bee venom   Review of Systems Review of Systems  Constitutional: Negative.   HENT: Positive for facial swelling.   Skin: Positive for color change.     Physical Exam Triage Vital Signs ED Triage Vitals [01/21/17 1655]  Enc Vitals Group     BP 155/98     Pulse Rate 79     Resp 18     Temp 98.8 F (37.1 C)     Temp Source Oral     SpO2 100 %     Weight      Height      Head Circumference      Peak Flow      Pain Score      Pain Loc      Pain Edu?      Excl. in GC?    No data found.   Updated Vital Signs BP 155/98 (BP Location: Right Arm)   Pulse 79   Temp 98.8 F (37.1 C) (Oral)   Resp 18   SpO2 100%   Physical Exam  Constitutional: He is oriented to person, place,  and time. He appears well-developed and well-nourished.  HENT:  Right Ear: External ear normal.  Left Ear: External ear normal.  Mouth/Throat: Oropharynx is clear and moist.  Eyes: Conjunctivae and EOM are normal. Pupils are equal, round, and reactive to light.  Neck: Normal range of motion. Neck supple.  Musculoskeletal: Normal range of motion.  Neurological: He is alert and oriented to person, place, and time.  Skin:  1 similar fluctuant area just anterior to the tragus on the left side of his face and erythema  Nursing note and vitals reviewed.    UC Treatments / Results  Labs (all labs ordered are listed, but only abnormal results are displayed) Labs Reviewed - No data to display  EKG  EKG Interpretation None       Radiology No results found.  Procedures .Marland KitchenIncision and Drainage Date/Time: 01/21/2017 5:18 PM Performed by: Elvina Sidle Authorized by: Elvina Sidle   Consent:    Consent obtained:  Verbal   Consent given by:  Patient  Risks discussed:  Infection   Alternatives discussed:  No treatment Location:    Type:  Abscess   Location:  Head   Head location:  Face Pre-procedure details:    Skin preparation:  Betadine Anesthesia (see MAR for exact dosages):    Anesthesia method:  Local infiltration   Local anesthetic:  Lidocaine 1% w/o epi Procedure type:    Complexity:  Simple Procedure details:    Needle aspiration: no     Incision types:  Stab incision   Incision depth:  Dermal   Scalpel blade:  11   Wound management:  Extensive cleaning   Drainage:  Bloody and purulent   Drainage amount:  Copious   Wound treatment:  Wound left open   Packing materials:  None Post-procedure details:    Patient tolerance of procedure:  Tolerated well, no immediate complications   (including critical care time)  Medications Ordered in UC Medications - No data to display   Initial Impression / Assessment and Plan / UC Course  I have reviewed the  triage vital signs and the nursing notes.  Pertinent labs & imaging results that were available during my care of the patient were reviewed by me and considered in my medical decision making (see chart for details).      Final Clinical Impressions(s) / UC Diagnoses   Final diagnoses:  Facial abscess    New Prescriptions New Prescriptions   DOXYCYCLINE (VIBRA-TABS) 100 MG TABLET    Take 1 tablet (100 mg total) by mouth 2 (two) times daily.     Elvina SidleKurt Shalin Linders, MD 01/21/17 [id number]

## 2017-01-21 NOTE — ED Triage Notes (Signed)
Noticed   A   Swollen    Tender  Area  To l   Side  Face      Noticed   Several  Days  Ago      Pt    Reports     Did  Not  Notice   Anything   Bite  Him             He  apperas  In no  Acute  Distress

## 2017-01-21 NOTE — Discharge Instructions (Signed)
Apply hot compresses to the abscess area several times a day for the next few days and gently massage the area to see if any more pus can be expressed. Please return if swelling returns, you develop fever, or pain is increasing.
